# Patient Record
Sex: Male | Born: 1965 | Race: White | Hispanic: No | Marital: Married | Smoking: Current every day smoker
Health system: Southern US, Community
[De-identification: ages and names within clinical notes are randomized; demographics above are authoritative.]

## PROBLEM LIST (undated history)

## (undated) DIAGNOSIS — E079 Disorder of thyroid, unspecified: Secondary | ICD-10-CM

## (undated) DIAGNOSIS — I10 Essential (primary) hypertension: Secondary | ICD-10-CM

## (undated) HISTORY — PX: OTHER SURGICAL HISTORY: SHX169

---

## 2020-06-04 ENCOUNTER — Emergency Department (HOSPITAL_COMMUNITY)
Admission: EM | Admit: 2020-06-04 | Discharge: 2020-06-04 | Disposition: A | Discharge status: Home / Self Care | Payer: PRIVATE HEALTH INSURANCE | Attending: Emergency Medicine | Admitting: Emergency Medicine

## 2020-06-04 ENCOUNTER — Emergency Department (HOSPITAL_COMMUNITY): Payer: PRIVATE HEALTH INSURANCE

## 2020-06-04 ENCOUNTER — Encounter (HOSPITAL_COMMUNITY): Payer: Self-pay

## 2020-06-04 ENCOUNTER — Other Ambulatory Visit: Payer: Self-pay

## 2020-06-04 DIAGNOSIS — F17210 Nicotine dependence, cigarettes, uncomplicated: Secondary | ICD-10-CM | POA: Diagnosis not present

## 2020-06-04 DIAGNOSIS — R519 Headache, unspecified: Secondary | ICD-10-CM | POA: Insufficient documentation

## 2020-06-04 DIAGNOSIS — W1789XA Other fall from one level to another, initial encounter: Secondary | ICD-10-CM | POA: Diagnosis not present

## 2020-06-04 DIAGNOSIS — R102 Pelvic and perineal pain: Secondary | ICD-10-CM | POA: Insufficient documentation

## 2020-06-04 DIAGNOSIS — I10 Essential (primary) hypertension: Secondary | ICD-10-CM | POA: Insufficient documentation

## 2020-06-04 DIAGNOSIS — M545 Low back pain, unspecified: Secondary | ICD-10-CM | POA: Diagnosis not present

## 2020-06-04 DIAGNOSIS — M542 Cervicalgia: Secondary | ICD-10-CM | POA: Diagnosis not present

## 2020-06-04 DIAGNOSIS — W19XXXA Unspecified fall, initial encounter: Secondary | ICD-10-CM

## 2020-06-04 DIAGNOSIS — M5416 Radiculopathy, lumbar region: Secondary | ICD-10-CM

## 2020-06-04 DIAGNOSIS — S39012A Strain of muscle, fascia and tendon of lower back, initial encounter: Secondary | ICD-10-CM

## 2020-06-04 DIAGNOSIS — M549 Dorsalgia, unspecified: Secondary | ICD-10-CM

## 2020-06-04 HISTORY — DX: Essential (primary) hypertension: I10

## 2020-06-04 HISTORY — DX: Disorder of thyroid, unspecified: E07.9

## 2020-06-04 LAB — CBC
HCT: 43.3 % (ref 39.0–52.0)
Hemoglobin: 14.6 g/dL (ref 13.0–17.0)
MCH: 30.7 pg (ref 26.0–34.0)
MCHC: 33.7 g/dL (ref 30.0–36.0)
MCV: 91.2 fL (ref 80.0–100.0)
Platelets: 215 10*3/uL (ref 150–400)
RBC: 4.75 MIL/uL (ref 4.22–5.81)
RDW: 13.1 % (ref 11.5–15.5)
WBC: 6.1 10*3/uL (ref 4.0–10.5)
nRBC: 0 % (ref 0.0–0.2)

## 2020-06-04 LAB — BASIC METABOLIC PANEL
Anion gap: 8 (ref 5–15)
BUN: 13 mg/dL (ref 6–20)
CO2: 25 mmol/L (ref 22–32)
Calcium: 9 mg/dL (ref 8.9–10.3)
Chloride: 106 mmol/L (ref 98–111)
Creatinine, Ser: 0.89 mg/dL (ref 0.61–1.24)
GFR, Estimated: >60 mL/min (ref >60)
Glucose, Bld: 88 mg/dL (ref 70–99)
Potassium: 3.7 mmol/L (ref 3.5–5.1)
Sodium: 139 mmol/L (ref 135–145)

## 2020-06-04 MED ORDER — IBUPROFEN 800 MG PO TABS: 800.0000 mg | ORAL_TABLET | Freq: Three times a day (TID) | ORAL | 1 refills | Status: AC

## 2020-06-04 MED ORDER — HYDROMORPHONE HCL 1 MG/ML IJ SOLN
1.0000 mg | Freq: Once | INTRAMUSCULAR | Status: AC
  Administered 2020-06-04: 1 mg via INTRAVENOUS
  Filled 2020-06-04: qty 1

## 2020-06-04 MED ORDER — ONDANSETRON HCL 4 MG/2ML IJ SOLN
4.0000 mg | Freq: Once | INTRAMUSCULAR | Status: AC
  Administered 2020-06-04: 4 mg via INTRAVENOUS
  Filled 2020-06-04: qty 2

## 2020-06-04 MED ORDER — METHYLPREDNISOLONE 4 MG PO TBPK: ORAL_TABLET | ORAL | 0 refills | Status: AC

## 2020-06-04 MED ORDER — ACETAMINOPHEN 325 MG PO TABS: 650.0000 mg | ORAL_TABLET | Freq: Four times a day (QID) | ORAL | 0 refills | Status: AC | PRN

## 2020-06-04 MED ORDER — OXYCODONE HCL 5 MG PO TABS: 5.0000 mg | ORAL_TABLET | Freq: Four times a day (QID) | ORAL | 0 refills | Status: AC | PRN

## 2020-06-04 NOTE — ED Provider Notes (Signed)
[facility] EMERGENCY DEPARTMENT Provider Note   CSN: [id number] Arrival date & time: 06/04/20  1145     History Chief Complaint  Patient presents with  . Fall    Tom Acosta is a 55 y.o. male.  HPI Patient presents after a fall out of a semitruck.  Reportedly 2 days ago fell off the top step of his semi-.  Landed on his rear, lower back and hit the back of his head.  Some pain at the time but is worsened over the last 2 days.  Pain in the head.  Also states he has trouble moving his legs due to the severe pain.  Pain is mostly in the lower back.  No loss of bladder or bowel control.  No real abdominal pain.  Not on blood thinners.  No relief with Tylenol at home.  States he does not like coming to the ER.  History of hypertension but states has been off his medications for a while.    Past Medical History:  Diagnosis Date  . Hypertension   . Thyroid disease     There are no problems to display for this patient.   Past Surgical History:  Procedure Laterality Date  . sclerectomy of varicose veins         No family history on file.  Social History   Tobacco Use  . Smoking status: Current Every Day Smoker    Packs/day: 0.50    Types: Cigarettes  . Smokeless tobacco: Never Used  Substance Use Topics  . Alcohol use: Never  . Drug use: Never    Home Medications Prior to Admission medications   Not on File    Allergies    Patient has no known allergies.  Review of Systems   Review of Systems  Constitutional: Negative for appetite change.  HENT: Negative for congestion.   Respiratory: Negative for shortness of breath.   Cardiovascular: Negative for chest pain.  Gastrointestinal: Negative for abdominal pain.  Genitourinary: Negative for flank pain.  Musculoskeletal: Positive for back pain.  Skin: Negative for wound.  Neurological: Positive for weakness and headaches. Negative for numbness.  Psychiatric/Behavioral: Negative for confusion.     Physical Exam Updated Vital Signs BP (!) 162/97   Pulse (!) 46   Temp 98.5 F (36.9 C) (Oral)   Resp (!) 22   Ht 5\' 9"  (1.753 m)   Wt 95.3 kg   SpO2 100%   BMI 31.01 kg/m   Physical Exam Vitals and nursing note reviewed.  HENT:     Head:     Comments: Tenderness over right lower posterior skull.    Mouth/Throat:     Mouth: Mucous membranes are moist.  Eyes:     Extraocular Movements: Extraocular movements intact.  Neck:     Comments: Some upper midline spine tenderness. Cardiovascular:     Rate and Rhythm: Regular rhythm.  Pulmonary:     Breath sounds: No wheezing or rhonchi.  Abdominal:     Tenderness: There is no abdominal tenderness.  Musculoskeletal:        General: Tenderness present.     Comments: Tenderness over cervical spine lumbar spine and lower posterior pelvis.  Pain with straight leg raise bilaterally.  May have some decreased flexion extension at the ankle but somewhat limited by pain.  Skin:    Capillary Refill: Capillary refill takes less than 2 seconds.     Comments: Patient with multiple tattoos.  Neurological:     Mental  Status: He is alert and oriented to person, place, and time.  Psychiatric:        Mood and Affect: Mood normal.     ED Results / Procedures / Treatments   Labs (all labs ordered are listed, but only abnormal results are displayed) Labs Reviewed  BASIC METABOLIC PANEL  CBC    EKG None  Radiology DG Pelvis Portable  Result Date: 06/04/2020 CLINICAL DATA:  Fall. EXAM: PORTABLE PELVIS 1-2 VIEWS COMPARISON:  No prior. FINDINGS: Mild degenerative changes lumbar spine and both hips. No acute bony or joint abnormality. No evidence of fracture dislocation. Soft tissues unremarkable. IMPRESSION: Mild degenerative changes lumbar spine and both hips. No acute abnormality identified. Electronically Signed   By: Maisie Fus  Register   On: 06/04/2020 13:17    Procedures Procedures   Medications Ordered in ED Medications   HYDROmorphone (DILAUDID) injection 1 mg (1 mg Intravenous Given 06/04/20 1332)  ondansetron (ZOFRAN) injection 4 mg (4 mg Intravenous Given 06/04/20 1331)    ED Course  I have reviewed the triage vital signs and the nursing notes.  Pertinent labs & imaging results that were available during my care of the patient were reviewed by me and considered in my medical decision making (see chart for details).    MDM Rules/Calculators/A&P                          *Patient with fall out of semitruck 2 days ago.  Now severe low back pain and pelvic pain.  Also hit his head and neck.  States legs feel weak.  Pelvic x-ray reassuring but had cervical spine lumbar and pelvic CT is pending.  Care will be turned over to oncoming provider, Dr. Renaye Rakers Final Clinical Impression(s) / ED Diagnoses Final diagnoses:  Fall, initial encounter    Rx / DC Orders ED Discharge Orders    None       Benjiman Core, MD 06/04/20 1442

## 2020-06-04 NOTE — ED Notes (Signed)
Pt has 1+ swelling of right ankle, 2+ right pedal pulse, warm to touch, pt able to wiggle toes.

## 2020-06-04 NOTE — ED Notes (Signed)
I had pt walk per MD order and pt walked slow, but was able to walk

## 2020-06-04 NOTE — ED Notes (Signed)
I applied a [place] J collar to pt

## 2020-06-04 NOTE — ED Provider Notes (Signed)
Clinical Course as of 06/04/20 1751  Tue Jun 04, 2020  1532 Pt signed out to me by Dr Rubin Payor.  Briefly 55 year old male presenting with a fall 2 days ago out of the back of a truck, reporting low back pain.  Pending CT imaging.  Pt is from [place], planning to return [MT]  1653 I contacted CT about the pending images - the tech is looking at the issue now, images are completed but not crossing over [MT]    Clinical Course User Index [MT] Rosine Solecki, Kermit Balo, MD    Patient ambulated successfully.  Wife at bedside.  We will discharge - they are driving back to [place] today.  I will prescribe motrin, tylenol, short course of oxycodone given the understandably long and uncomfortable drive ahead of them to [place], and also a medrol dose pack for some mild left sided radiculopathy (back pain radiates to his left knee).  No objective motor weakness on exam.  Doubt cauda equina.  Suspect he is experiencing lumbar and cervical muscle strain from his accident.  We provided a cane, and I encouraged follow up with his doctor next week when he returns to [place].  Both he and his wife verbalized understanding and agreement with this plan.    Terald Sleeper, MD 06/04/20 [phone]

## 2020-06-04 NOTE — ED Triage Notes (Addendum)
Pt arrived via [facility]. Pt was climbing out of tractor trailor backwards and fell and hit back, tailbone and posterior of head 2 days ago. Pt did have LOC. Pt denies blood thinners. EMS  Gave fentanyl IV. Pt c/o 10/10 pain in tailbone. Pt states has numbness in left knee and foot. Pt denies loss of bowl or bladder. Pt is A&Ox4. Pt able to move all extremities.

## 2020-06-04 NOTE — Discharge Instructions (Addendum)
Your CT scans did not show fractures of your upper or lower back, your hips, or injury to your brain.  You may be having severe muscle spasms from your fall.  You also likely have a pinched nerve causing the pain traveling down your left leg.  For pain at home:  Take ibuprofen 800 mg every 8 hours (with food) Take tylenol 650 mg every 6 hours For severe pain, you can take oxycodone 5 mg every 6 hours.  We do not provide long-term narcotic prescriptions in the ER.  You will need to see a primary care doctor or specialist for further pain management when you return to [place]. Start taking prednisone (the medrol dose pack) tomorrow morning with breakfast.  This is a steroid medicine. This can help with your nerve pain.  If you start having side effects from prednisone like stomach upset, belching, racing thoughts, or difficulty sleeping, you can stop taking this medication.  Schedule an appointment with a primary care provider or spine doctor in 1 week when you return to [place] to follow up on this injury.   Bring your CT scan reports with you.

## 2021-08-17 IMAGING — CT CT HEAD W/O CM
4 series · 16 of 47 positions shown, 18 images · non-contrast
Comparison: None.

CLINICAL DATA: Status post fall from the cab of a truck this
morning. Initial encounter.

EXAM:
CT HEAD WITHOUT CONTRAST
CT CERVICAL SPINE WITHOUT CONTRAST
TECHNIQUE: Multidetector CT imaging of the head and cervical spine was
performed following the standard protocol without intravenous
contrast. Multiplanar CT image reconstructions of the cervical spine
were also generated.

[Series 4: head wo · axial · 0.39mm/px · z∈[+1256,+1376]mm · 7 of 34 slices shown, 9 images]
[im 5/34  brain]
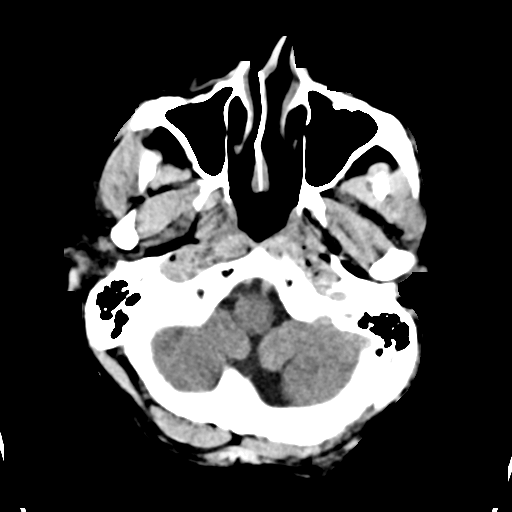
[im 5/34  bone]
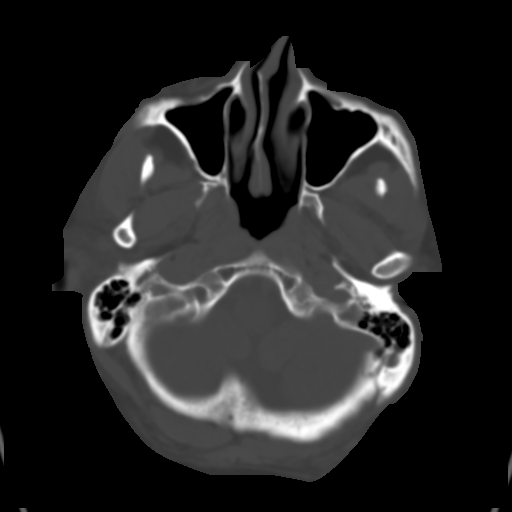
[im 9/34  brain]
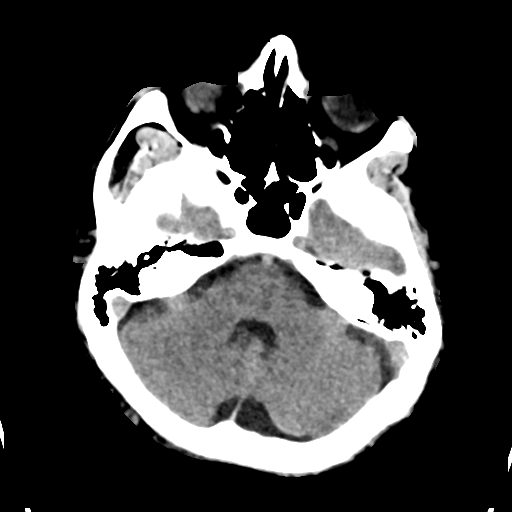
[im 13/34  brain]
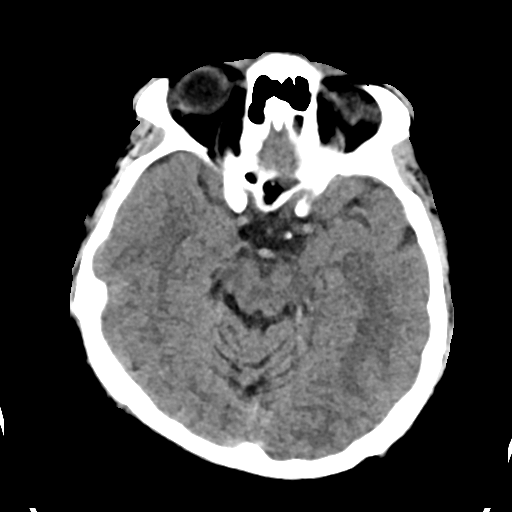
[im 17/34  brain]
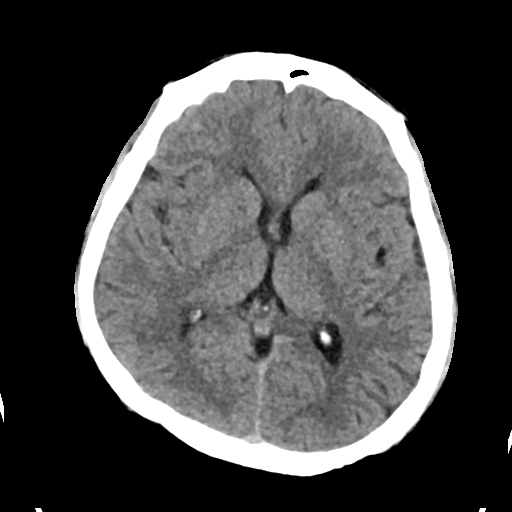
[im 21/34  brain]
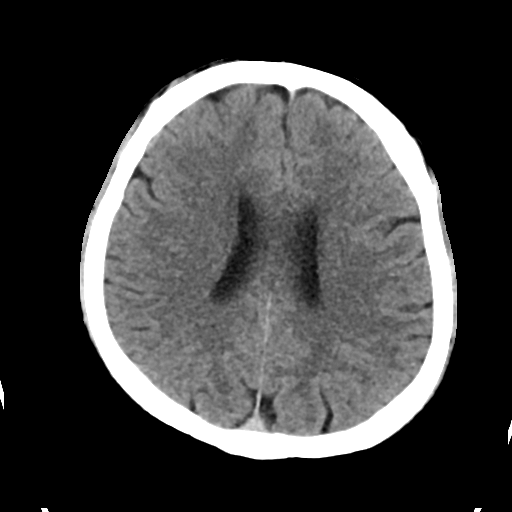
[im 21/34  bone]
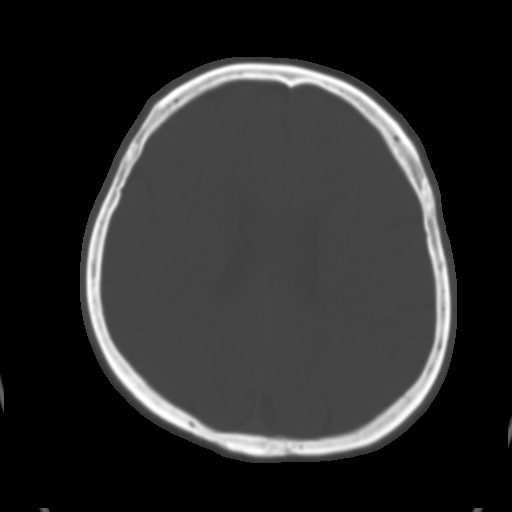
[im 25/34  brain]
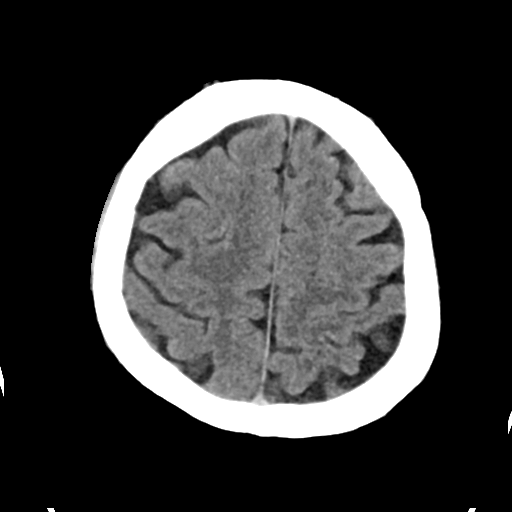
[im 29/34  brain]
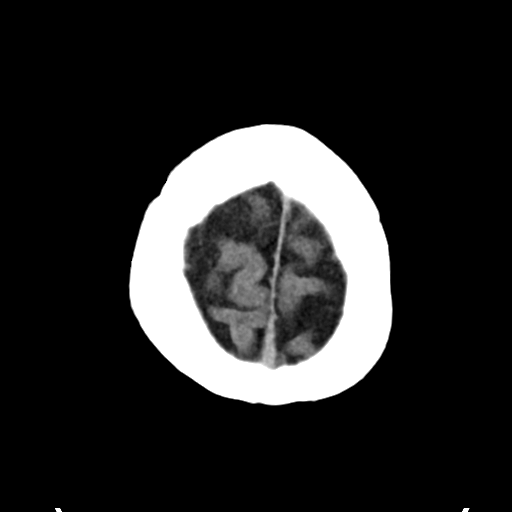

[Series 5: head bone · axial · 0.39mm/px · z∈[+1252,+1284]mm · 3 of 83 slices shown]
[im 9/83  bone]
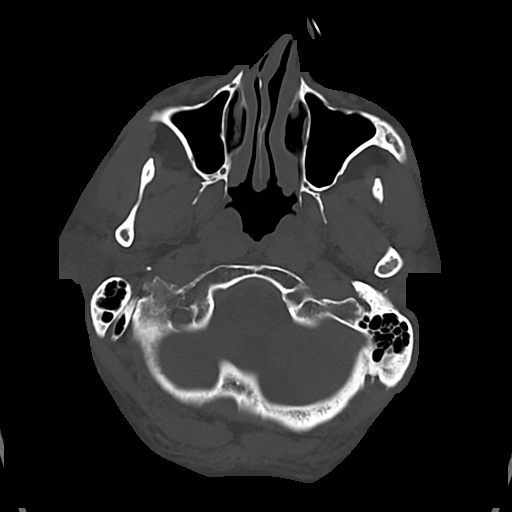
[im 17/83  bone]
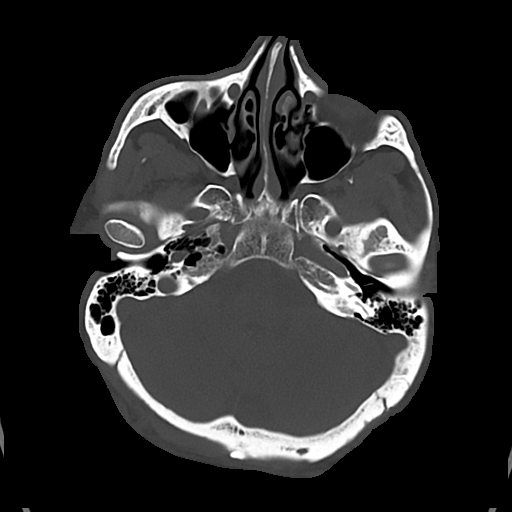
[im 25/83  bone]
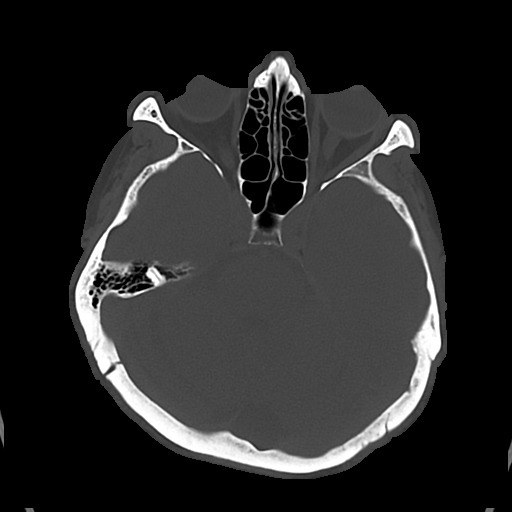

[Series 6: cor soft · coronal · 0.33mm/px · 3 of 66 slices shown]
[im 22/66  brain]
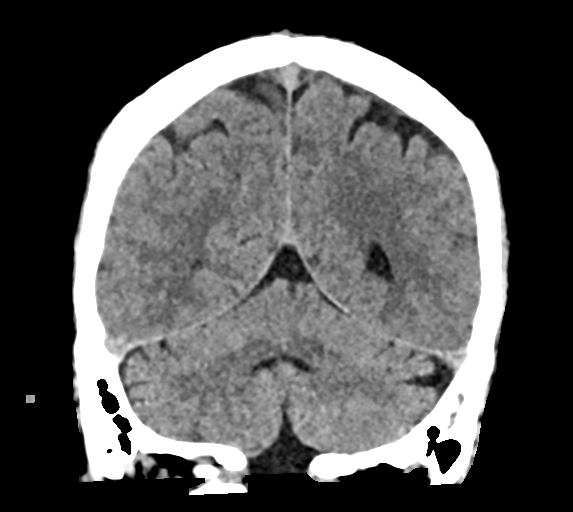
[im 29/66  brain]
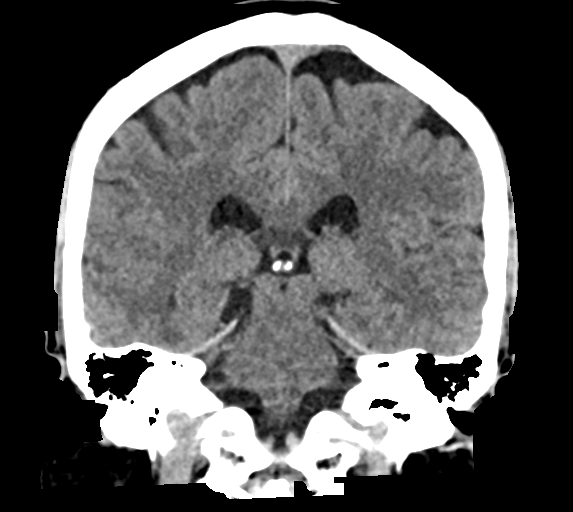
[im 37/66  brain]
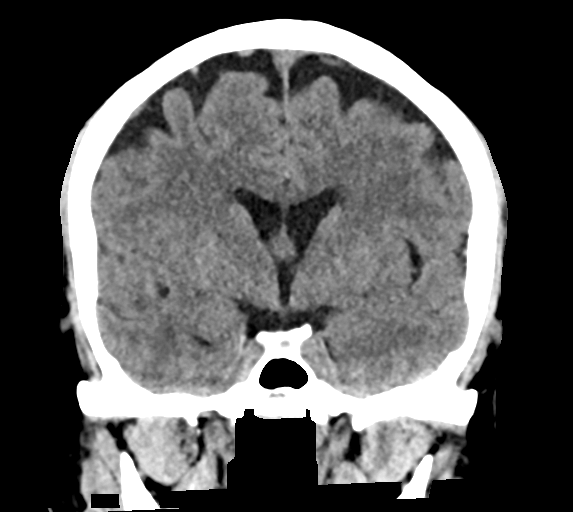

[Series 7: sag soft · sagittal · 0.33mm/px · 3 of 64 slices shown]
[im 22/64  brain]
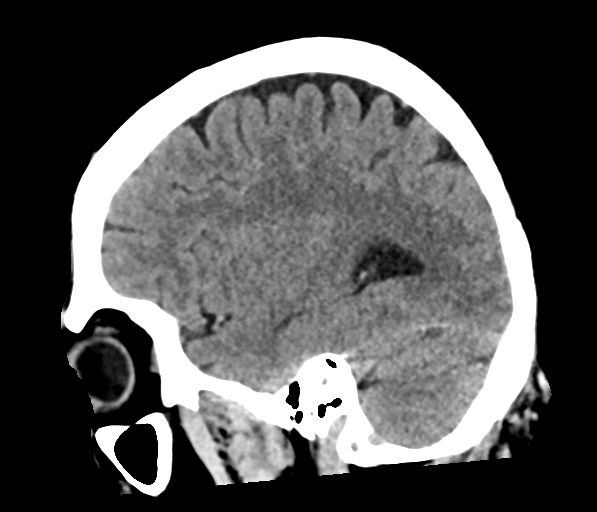
[im 32/64  brain]
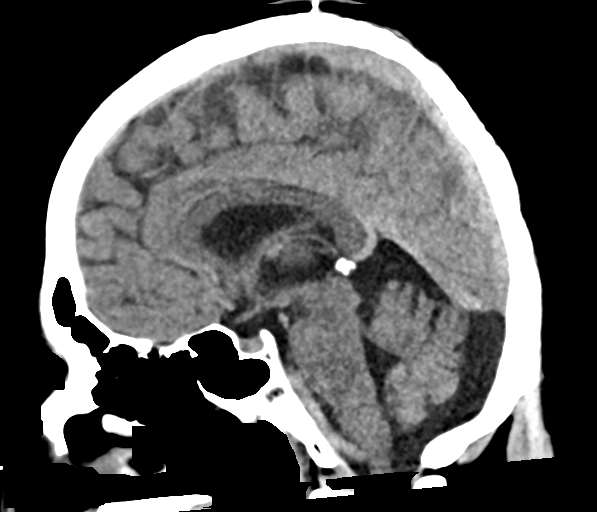
[im 43/64  brain]
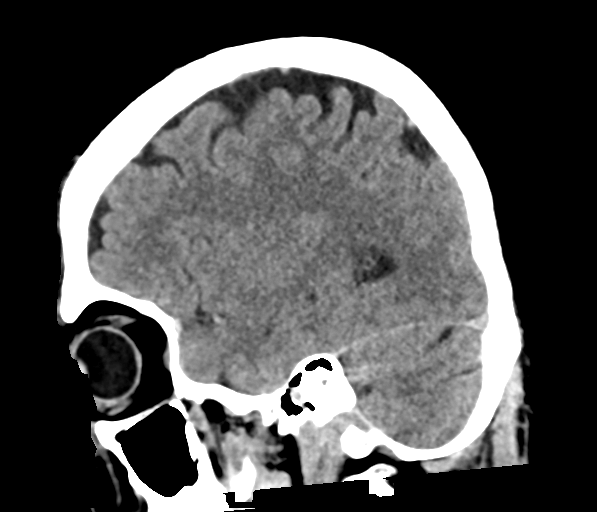

[16 of 47 positions shown; findings below may reference images not displayed]

FINDINGS: CT HEAD FINDINGS

Brain: No evidence of acute infarction, hemorrhage, hydrocephalus,
extra-axial collection or mass lesion/mass effect.

Vascular: No hyperdense vessel or unexpected calcification.

Skull: Normal. Negative for fracture or focal lesion.

Sinuses/Orbits: Negative.

Other: None.

CT CERVICAL SPINE FINDINGS

Alignment: Mild reversal of lordosis noted.

Skull base and vertebrae: No acute fracture. No primary bone lesion
or focal pathologic process.

Soft tissues and spinal canal: No prevertebral fluid or swelling. No
visible canal hematoma.

Disc levels:  Intervertebral disc space height is maintained.

Upper chest: Not imaged.

Other: None.
IMPRESSION: Negative head and cervical spine CT scans.
